# Patient Record
Sex: Female | Born: 2000
Health system: Southern US, Community
[De-identification: ages and names within clinical notes are randomized; demographics above are authoritative.]

---
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
## 2022-11-22 ENCOUNTER — Emergency Department (HOSPITAL_COMMUNITY)
Admission: EM | Admit: 2022-11-22 | Discharge: 2022-11-23 | Disposition: A | Discharge status: Home / Self Care | Payer: Medicaid Other | Attending: Emergency Medicine | Admitting: Emergency Medicine

## 2022-11-22 ENCOUNTER — Other Ambulatory Visit: Payer: Self-pay

## 2022-11-22 DIAGNOSIS — R519 Headache, unspecified: Secondary | ICD-10-CM | POA: Diagnosis present

## 2022-11-22 DIAGNOSIS — Y92410 Unspecified street and highway as the place of occurrence of the external cause: Secondary | ICD-10-CM | POA: Insufficient documentation
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
